# Patient Record
Sex: Male | Born: 1990 | Hispanic: No | Marital: Single | Smoking: Current some day smoker
Health system: Southern US, Community
[De-identification: ages and names within clinical notes are randomized; demographics above are authoritative.]

## PROBLEM LIST (undated history)

## (undated) DIAGNOSIS — D571 Sickle-cell disease without crisis: Secondary | ICD-10-CM

## (undated) DIAGNOSIS — K029 Dental caries, unspecified: Secondary | ICD-10-CM

## (undated) DIAGNOSIS — R040 Epistaxis: Secondary | ICD-10-CM

## (undated) DIAGNOSIS — N433 Hydrocele, unspecified: Secondary | ICD-10-CM

---
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
## 2010-01-25 ENCOUNTER — Emergency Department (HOSPITAL_COMMUNITY): Admission: EM | Admit: 2010-01-25 | Discharge: 2010-01-25 | Payer: Self-pay | Admitting: Family Medicine

## 2015-12-15 ENCOUNTER — Other Ambulatory Visit: Payer: Self-pay | Admitting: Family

## 2015-12-15 DIAGNOSIS — N5089 Other specified disorders of the male genital organs: Secondary | ICD-10-CM

## 2015-12-23 ENCOUNTER — Ambulatory Visit
Admission: RE | Admit: 2015-12-23 | Discharge: 2015-12-23 | Disposition: A | Discharge status: Home / Self Care | Payer: BC Managed Care – PPO | Source: Ambulatory Visit | Attending: Family | Admitting: Family

## 2015-12-23 DIAGNOSIS — N5089 Other specified disorders of the male genital organs: Secondary | ICD-10-CM

## 2016-02-21 ENCOUNTER — Other Ambulatory Visit: Payer: Self-pay | Admitting: Urology

## 2016-03-21 ENCOUNTER — Encounter (HOSPITAL_BASED_OUTPATIENT_CLINIC_OR_DEPARTMENT_OTHER): Payer: Self-pay | Admitting: *Deleted

## 2016-03-21 NOTE — Progress Notes (Signed)
Pt instructed npo pmn 10/08.  [facility] 1000.  Needs hgb on arrival.

## 2016-03-26 ENCOUNTER — Encounter (HOSPITAL_BASED_OUTPATIENT_CLINIC_OR_DEPARTMENT_OTHER)
Admission: RE | Disposition: A | Discharge status: Home / Self Care | Payer: Self-pay | Source: Ambulatory Visit | Attending: Urology

## 2016-03-26 ENCOUNTER — Ambulatory Visit (HOSPITAL_BASED_OUTPATIENT_CLINIC_OR_DEPARTMENT_OTHER): Payer: BC Managed Care – PPO | Admitting: Anesthesiology

## 2016-03-26 ENCOUNTER — Encounter (HOSPITAL_BASED_OUTPATIENT_CLINIC_OR_DEPARTMENT_OTHER): Payer: Self-pay | Admitting: Anesthesiology

## 2016-03-26 ENCOUNTER — Ambulatory Visit (HOSPITAL_BASED_OUTPATIENT_CLINIC_OR_DEPARTMENT_OTHER)
Admission: RE | Admit: 2016-03-26 | Discharge: 2016-03-26 | Disposition: A | Discharge status: Home / Self Care | Payer: BC Managed Care – PPO | Source: Ambulatory Visit | Attending: Urology | Admitting: Urology

## 2016-03-26 DIAGNOSIS — D571 Sickle-cell disease without crisis: Secondary | ICD-10-CM | POA: Insufficient documentation

## 2016-03-26 DIAGNOSIS — N433 Hydrocele, unspecified: Secondary | ICD-10-CM | POA: Insufficient documentation

## 2016-03-26 DIAGNOSIS — F17210 Nicotine dependence, cigarettes, uncomplicated: Secondary | ICD-10-CM | POA: Diagnosis not present

## 2016-03-26 HISTORY — DX: Hydrocele, unspecified: N43.3

## 2016-03-26 HISTORY — DX: Epistaxis: R04.0

## 2016-03-26 HISTORY — PX: HYDROCELE EXCISION: SHX482

## 2016-03-26 HISTORY — DX: Sickle-cell disease without crisis: D57.1

## 2016-03-26 HISTORY — DX: Dental caries, unspecified: K02.9

## 2016-03-26 LAB — POCT HEMOGLOBIN-HEMACUE: HEMOGLOBIN: 11.7 g/dL — AB (ref 13.0–17.0)

## 2016-03-26 SURGERY — HYDROCELECTOMY
Anesthesia: General | Laterality: Bilateral

## 2016-03-26 MED ORDER — MIDAZOLAM HCL 2 MG/2ML IJ SOLN
INTRAMUSCULAR | Status: AC
  Filled 2016-03-26: qty 2

## 2016-03-26 MED ORDER — DEXAMETHASONE SODIUM PHOSPHATE 10 MG/ML IJ SOLN
INTRAMUSCULAR | Status: AC
  Filled 2016-03-26: qty 1

## 2016-03-26 MED ORDER — CEFAZOLIN SODIUM-DEXTROSE 2-4 GM/100ML-% IV SOLN
INTRAVENOUS | Status: AC
  Filled 2016-03-26: qty 100

## 2016-03-26 MED ORDER — PROPOFOL 10 MG/ML IV BOLUS
INTRAVENOUS | Status: AC
  Filled 2016-03-26: qty 20

## 2016-03-26 MED ORDER — WHITE PETROLATUM GEL
Status: AC
  Filled 2016-03-26: qty 5

## 2016-03-26 MED ORDER — OXYCODONE-ACETAMINOPHEN 5-325 MG PO TABS: 1.0000 | ORAL_TABLET | ORAL | 0 refills | Status: AC | PRN

## 2016-03-26 MED ORDER — OXYCODONE HCL 5 MG PO TABS
5.0000 mg | ORAL_TABLET | Freq: Once | ORAL | Status: AC | PRN
  Administered 2016-03-26: 5 mg via ORAL
  Filled 2016-03-26: qty 1

## 2016-03-26 MED ORDER — DEXAMETHASONE SODIUM PHOSPHATE 4 MG/ML IJ SOLN
INTRAMUSCULAR | Status: DC | PRN
  Administered 2016-03-26: 10 mg via INTRAVENOUS

## 2016-03-26 MED ORDER — PHENYLEPHRINE 40 MCG/ML (10ML) SYRINGE FOR IV PUSH (FOR BLOOD PRESSURE SUPPORT)
PREFILLED_SYRINGE | INTRAVENOUS | Status: AC
Start: 2016-03-26 — End: 2016-03-26
  Filled 2016-03-26: qty 20

## 2016-03-26 MED ORDER — SULFAMETHOXAZOLE-TRIMETHOPRIM 800-160 MG PO TABS: 1.0000 | ORAL_TABLET | Freq: Two times a day (BID) | ORAL | 0 refills | Status: AC

## 2016-03-26 MED ORDER — FENTANYL CITRATE (PF) 100 MCG/2ML IJ SOLN
25.0000 ug | INTRAMUSCULAR | Status: DC | PRN
  Administered 2016-03-26 (×3): 50 ug via INTRAVENOUS
  Filled 2016-03-26: qty 1

## 2016-03-26 MED ORDER — BUPIVACAINE HCL (PF) 0.25 % IJ SOLN
INTRAMUSCULAR | Status: DC | PRN
  Administered 2016-03-26: 10 mL

## 2016-03-26 MED ORDER — CEFAZOLIN IN D5W 1 GM/50ML IV SOLN
1.0000 g | INTRAVENOUS | Status: DC
  Filled 2016-03-26: qty 50

## 2016-03-26 MED ORDER — FENTANYL CITRATE (PF) 100 MCG/2ML IJ SOLN
INTRAMUSCULAR | Status: AC
  Filled 2016-03-26: qty 2

## 2016-03-26 MED ORDER — FENTANYL CITRATE (PF) 100 MCG/2ML IJ SOLN
INTRAMUSCULAR | Status: DC | PRN
  Administered 2016-03-26 (×2): 50 ug via INTRAVENOUS

## 2016-03-26 MED ORDER — BUPIVACAINE HCL (PF) 0.25 % IJ SOLN
INTRAMUSCULAR | Status: AC
  Filled 2016-03-26: qty 30

## 2016-03-26 MED ORDER — LACTATED RINGERS IV SOLN
INTRAVENOUS | Status: DC
  Administered 2016-03-26 (×2): via INTRAVENOUS
  Filled 2016-03-26 (×2): qty 1000

## 2016-03-26 MED ORDER — CEFAZOLIN SODIUM-DEXTROSE 2-4 GM/100ML-% IV SOLN
2.0000 g | INTRAVENOUS | Status: AC
  Administered 2016-03-26: 2 g via INTRAVENOUS
  Filled 2016-03-26: qty 100

## 2016-03-26 MED ORDER — OXYCODONE HCL 5 MG PO TABS
ORAL_TABLET | ORAL | Status: AC
Start: 2016-03-26 — End: 2016-03-26
  Filled 2016-03-26: qty 1

## 2016-03-26 MED ORDER — OXYCODONE HCL 5 MG/5ML PO SOLN
5.0000 mg | Freq: Once | ORAL | Status: AC | PRN
  Filled 2016-03-26: qty 5

## 2016-03-26 MED ORDER — MIDAZOLAM HCL 5 MG/5ML IJ SOLN
INTRAMUSCULAR | Status: DC | PRN
  Administered 2016-03-26: 2 mg via INTRAVENOUS

## 2016-03-26 MED ORDER — PROMETHAZINE HCL 25 MG/ML IJ SOLN
6.2500 mg | INTRAMUSCULAR | Status: DC | PRN
  Filled 2016-03-26: qty 1

## 2016-03-26 MED ORDER — ONDANSETRON HCL 4 MG/2ML IJ SOLN
INTRAMUSCULAR | Status: DC | PRN
  Administered 2016-03-26: 4 mg via INTRAVENOUS

## 2016-03-26 MED ORDER — ONDANSETRON HCL 4 MG/2ML IJ SOLN
INTRAMUSCULAR | Status: AC
  Filled 2016-03-26: qty 2

## 2016-03-26 MED ORDER — PROPOFOL 10 MG/ML IV BOLUS
INTRAVENOUS | Status: DC | PRN
  Administered 2016-03-26: 200 mg via INTRAVENOUS

## 2016-03-26 MED ORDER — LIDOCAINE 2% (20 MG/ML) 5 ML SYRINGE
INTRAMUSCULAR | Status: DC | PRN
  Administered 2016-03-26: 75 mg via INTRAVENOUS

## 2016-03-26 SURGICAL SUPPLY — 43 items
BAG URINE DRAINAGE (UROLOGICAL SUPPLIES) IMPLANT
BLADE CLIPPER SENSICLIP SURGIC (BLADE) IMPLANT
BLADE SURG 15 STRL LF DISP TIS (BLADE) ×1 IMPLANT
BLADE SURG 15 STRL SS (BLADE) ×2
CATH FOLEY 2WAY SLVR  5CC 16FR (CATHETERS)
CATH FOLEY 2WAY SLVR 5CC 16FR (CATHETERS) IMPLANT
COVER BACK TABLE 60X90IN (DRAPES) ×3 IMPLANT
COVER MAYO STAND STRL (DRAPES) ×3 IMPLANT
DISSECTOR ROUND CHERRY 3/8 STR (MISCELLANEOUS) IMPLANT
DRAIN PENROSE 18X1/2 LTX STRL (DRAIN) ×3 IMPLANT
DRAIN PENROSE 18X1/4 LTX STRL (WOUND CARE) ×3 IMPLANT
DRAPE LAPAROTOMY 100X72 PEDS (DRAPES) ×3 IMPLANT
DRSG TEGADERM 4X4.75 (GAUZE/BANDAGES/DRESSINGS) IMPLANT
ELECT NEEDLE BLADE 2-5/6 (NEEDLE) ×3 IMPLANT
ELECT REM PT RETURN 9FT ADLT (ELECTROSURGICAL) ×3
ELECTRODE REM PT RTRN 9FT ADLT (ELECTROSURGICAL) ×1 IMPLANT
GLOVE BIO SURGEON STRL SZ8 (GLOVE) ×3 IMPLANT
GOWN STRL REUS W/ TWL LRG LVL3 (GOWN DISPOSABLE) ×1 IMPLANT
GOWN STRL REUS W/ TWL XL LVL3 (GOWN DISPOSABLE) ×1 IMPLANT
GOWN STRL REUS W/TWL LRG LVL3 (GOWN DISPOSABLE) ×2
GOWN STRL REUS W/TWL XL LVL3 (GOWN DISPOSABLE) ×2
KIT ROOM TURNOVER WOR (KITS) ×3 IMPLANT
LIQUID BAND (GAUZE/BANDAGES/DRESSINGS) ×3 IMPLANT
MANIFOLD NEPTUNE II (INSTRUMENTS) IMPLANT
NEEDLE HYPO 25X1 1.5 SAFETY (NEEDLE) ×3 IMPLANT
NS IRRIG 500ML POUR BTL (IV SOLUTION) IMPLANT
PACK BASIN DAY SURGERY FS (CUSTOM PROCEDURE TRAY) ×3 IMPLANT
PENCIL BUTTON HOLSTER BLD 10FT (ELECTRODE) ×3 IMPLANT
SUPPORT SCROTAL LG STRP (MISCELLANEOUS) ×2 IMPLANT
SUPPORTER ATHLETIC LG (MISCELLANEOUS) ×1
SUT ETHILON 4 0 PS 2 18 (SUTURE) ×3 IMPLANT
SUT MNCRL AB 4-0 PS2 18 (SUTURE) ×3 IMPLANT
SUT SILK 0 SH 30 (SUTURE) IMPLANT
SUT VIC AB 2-0 SH 27 (SUTURE) ×4
SUT VIC AB 2-0 SH 27XBRD (SUTURE) ×2 IMPLANT
SYR 30ML LL (SYRINGE) IMPLANT
SYR CONTROL 10ML LL (SYRINGE) ×3 IMPLANT
TOWEL OR 17X24 6PK STRL BLUE (TOWEL DISPOSABLE) ×6 IMPLANT
TRAY DSU PREP LF (CUSTOM PROCEDURE TRAY) ×3 IMPLANT
TUBE CONNECTING 12'X1/4 (SUCTIONS) ×1
TUBE CONNECTING 12X1/4 (SUCTIONS) ×2 IMPLANT
WATER STERILE IRR 500ML POUR (IV SOLUTION) IMPLANT
YANKAUER SUCT BULB TIP NO VENT (SUCTIONS) ×3 IMPLANT

## 2016-03-26 NOTE — H&P (Signed)
Urology Admission H&P  Chief Complaint: scrotal swelling  History of Present Illness: Mr Gwynneth Munsonxum is a 25yo with a h xof bilateral hydroceles that are causing pain and difficulty with walking  Past Medical History:  Diagnosis Date  . Dental cavities    and cracks  . Hydrocele, bilateral   . Nosebleed    hx of frequent nosebleeds as a child  . Sickle cell disease (HCC)    History reviewed. No pertinent surgical history.  Home Medications:  Prescriptions Prior to Admission  Medication Sig Dispense Refill Last Dose  . Multiple Vitamins-Minerals (MULTIVITAMIN WITH MINERALS) tablet Take 1 tablet by mouth daily.   03/25/2016 at Unknown time   Allergies: No Known Allergies  History reviewed. No pertinent family history. Social History:  reports that he has been smoking.  He has been smoking about 0.25 packs per day. He has never used smokeless tobacco. He reports that he drinks alcohol. His drug history is not on file.  Review of Systems  All other systems reviewed and are negative.   Physical Exam:  Vital signs in last 24 hours: Temp:  [98.6 F (37 C)] 98.6 F (37 C) (10/09 1047) Pulse Rate:  [89] 89 (10/09 1047) Resp:  [16] 16 (10/09 1047) BP: (158)/(68) 158/68 (10/09 1047) SpO2:  [100 %] 100 % (10/09 1047) Weight:  [81.2 kg (179 lb)] 81.2 kg (179 lb) (10/09 1047) Physical Exam  Constitutional: He is oriented to person, place, and time. He appears well-developed and well-nourished.  HENT:  Head: Normocephalic and atraumatic.  Eyes: EOM are normal. Pupils are equal, round, and reactive to light.  Neck: Normal range of motion. No thyromegaly present.  Cardiovascular: Normal rate and regular rhythm.   Respiratory: Effort normal. No respiratory distress.  GI: Soft. He exhibits no distension.  Musculoskeletal: Normal range of motion. He exhibits no edema.  Neurological: He is alert and oriented to person, place, and time.  Skin: Skin is warm and dry.  Psychiatric: He has a  normal mood and affect. His behavior is normal. Judgment and thought content normal.    Laboratory Data:  Results for orders placed or performed during the hospital encounter of 03/26/16 (from the past 24 hour(s))  Hemoglobin-hemacue, POC     Status: Abnormal   Collection Time: 03/26/16 11:10 AM  Result Value Ref Range   Hemoglobin 11.7 (L) 13.0 - 17.0 g/dL   No results found for this or any previous visit (from the past 240 hour(s)). Creatinine: No results for input(s): CREATININE in the last 168 hours. Baseline Creatinine: unknwon  Impression/Assessment:  25yo with bilateral hydroceles  Plan:  The risks/benefits/alternatives to bilateral hydrocelectomy was explained to the patietn and she understands and wishes to proceed with surgery  Wilkie AyePatrick Deja Kaigler 03/26/2016, 11:48 AM

## 2016-03-26 NOTE — Anesthesia Procedure Notes (Signed)
Procedure Name: LMA Insertion Date/Time: 03/26/2016 11:58 AM Performed by: Tyrone NineSAUVE, Casin Federici F Pre-anesthesia Checklist: Patient identified, Timeout performed, Emergency Drugs available, Suction available and Patient being monitored Patient Re-evaluated:Patient Re-evaluated prior to inductionOxygen Delivery Method: Circle system utilized Preoxygenation: Pre-oxygenation with 100% oxygen Intubation Type: IV induction Ventilation: Mask ventilation without difficulty LMA: LMA inserted LMA Size: 4.0 Number of attempts: 1 Placement Confirmation: positive ETCO2 and breath sounds checked- equal and bilateral Tube secured with: Tape Dental Injury: Teeth and Oropharynx as per pre-operative assessment

## 2016-03-26 NOTE — Op Note (Signed)
Preoperative diagnosis: bilateral Hydrocele  Postoperative diagnosis: Same  Procedure: 1. Excision of bilateral appendix testis 2. bilateral hydrocelectomy  Attending: Wilkie AyePatrick Courteney Alderete, MD  Anesthesia: General  History of blood loss: Minimal  Antibiotics: ancef  Drains: bilateral penrose  Specimens: 1. Bilateral  hydrocele sac   Findings: large bilateral hydroceles  Indications: Patient is a 25 year old male with a history of bilateral hydrocele that was growing in size and causing him pain with walking.  We discussed the treatment options including observation versus excision after discussing treatment options he proceed with excision.   Procedure in detail: Prior to procedure consent was obtained.  Patient was brought to the operating room and a brief timeout was done to ensure correct patient, correct procedure, correct site.  General anesthesia was administered and patient was placed in supine position.  His genitalia was then prepped and draped in usual sterile fashion.  A 3 cm incision was made in the right hemiscrotum.  We dissected down to the tunica and then incised the tunica. A large hydrocele was encountered and was drained. We then excised the hydrocele sac and then over sewed the edge with 2-0 Vicryl in a running fashion. We then excised the right appendix testis. Hemostasis was then obtained with electrocautery. We then closed the defect in the epididymis with 3-0 vicryl in a running fashion. We then returned the testis to the rightt hemiscrotum and placed a drain through a separate incision. We then closed the overlying dartos with 3-0 vicryl in a running fashion.  We then turned our attention to the left side. A 3 cm incision was made in the left hemiscrotum.  We dissected down to the tunica and then incised the tunica. A large hydrocele was encountered and was drained. We then excised the hydrocele sac and then over sewed the edge with 2-0 Vicryl in a running fashion. We  then excised the right appendix testis. Hemostasis was then obtained with electrocautery. We then closed the defect in the epididymis with 3-0 vicryl in a running fashion. We then returned the testis to the left hemiscrotum and  Placed a drain throught a separate incision. We closed the overlying dartos with 3-0 vicryl in a running fashion. The skin was then closed with 4-0 monocryl in a running fashion. Dermabond was placed on the incision.  A dressing was then applied to the incision.  We then placed a scrotal fluff and this then concluded the procedure which was well tolerated by the patient.  Complications: None  Condition: Stable, extubated, transferred to PACU.  Plan: Patient is to be discharged home.  He is to remove his drains in 2 days. He is to follow up in 2 weeks for wound check.

## 2016-03-26 NOTE — Discharge Instructions (Addendum)
Hydrocelectomy, Care After Refer to this sheet in the next few weeks. These instructions provide you with information about caring for yourself after your procedure. Your health care provider may also give you more specific instructions. Your treatment has been planned according to current medical practices, but problems sometimes occur. Call your health care provider if you have any problems or questions after your procedure. WHAT TO EXPECT AFTER THE PROCEDURE After your procedure, it is common for the pouch that holds your testicles (scrotum) to be painful, swollen, and bruised. HOME CARE INSTRUCTIONS Bathing  Ask your health care provider when you can shower, take baths, or go swimming.  If you were told to wear an athletic support strap, take it off when you shower or take a bath. Incision Care  Follow instructions from your health care provider about how to take care of your incision. Make sure you:  Wash your hands with soap and water before you change your bandage (dressing). If soap and water are not available, use hand sanitizer.  Change your dressing as told by your health care provider.  Leave stitches (sutures) in place.  Check your incision and scrotum every day for signs of infection. Check for:  More redness, swelling, or pain.  Blood or fluid.  Warmth.  Pus or a bad smell. Managing Pain, Stiffness, and Swelling  If directed, apply ice to the injured area:  Put ice in a plastic bag.  Place a towel between your skin and the bag.  Leave the ice on for 20 minutes, 2-3 times per day. Driving  Do not drive for 24 hours if you received a sedative.  Do not drive or operate heavy machinery while taking prescription pain medicine.  Ask your health care provider when it is safe to drive. Activity  Do not do any activities that require great strength and energy (are vigorous) for as long as told by your health care provider.  Return to your normal activities as  told by your health care provider. Ask your health care provider what activities are safe for you.  Do not lift anything that is heavier than 10 lb (4.5 kg) until your health care provider says that it is safe. General Instructions  Take over-the-counter and prescription medicines only as told by your health care provider.  Keep all follow-up visits as told by your health care provider. This is important.  If you were given an athletic support strap, wear it as told by your health care provider.  If you had a drain put in during the procedure, you will need to return to have it removed. SEEK MEDICAL CARE IF:  Your pain gets worse.  You have more redness, swelling, or pain around your scrotum.  You have blood or fluid coming from your scrotum.  Your incision feels warm to the touch.  You have pus or a bad smell coming from your scrotum.  You have a fever.    This information is not intended to replace advice given to you by your health care provider. Make sure you discuss any questions you have with your health care provider.   Document Released: 02/23/2015 Document Reviewed: 12/01/2014 Elsevier Interactive Patient Education 2016 [employer].  Remove drain Wednesday or Thursday at home  Post Anesthesia Home Care Instructions  Activity: Get plenty of rest for the remainder of the day. A responsible adult should stay with you for 24 hours following the procedure.  For the next 24 hours, DO NOT: -Drive a car -  Operate machinery -Drink alcoholic beverages -Take any medication unless instructed by your physician -Make any legal decisions or sign important papers.  Meals: Start with liquid foods such as gelatin or soup. Progress to regular foods as tolerated. Avoid greasy, spicy, heavy foods. If nausea and/or vomiting occur, drink only clear liquids until the nausea and/or vomiting subsides. Call your physician if vomiting continues.  Special Instructions/Symptoms: Your  throat may feel dry or sore from the anesthesia or the breathing tube placed in your throat during surgery. If this causes discomfort, gargle with warm salt water. The discomfort should disappear within 24 hours.  If you had a scopolamine patch placed behind your ear for the management of post- operative nausea and/or vomiting:  1. The medication in the patch is effective for 72 hours, after which it should be removed.  Wrap patch in a tissue and discard in the trash. Wash hands thoroughly with soap and water. 2. You may remove the patch earlier than 72 hours if you experience unpleasant side effects which may include dry mouth, dizziness or visual disturbances. 3. Avoid touching the patch. Wash your hands with soap and water after contact with the patch.

## 2016-03-26 NOTE — Anesthesia Preprocedure Evaluation (Addendum)
Anesthesia Evaluation  Patient identified by MRN, date of birth, ID band Patient awake    Reviewed: Allergy & Precautions, NPO status , Patient's Chart, lab work & pertinent test results  Airway Mallampati: II  TM Distance: >3 FB Neck ROM: Full    Dental no notable dental hx.    Pulmonary Current Smoker,    Pulmonary exam normal breath sounds clear to auscultation       Cardiovascular negative cardio ROS Normal cardiovascular exam Rhythm:Regular Rate:Normal     Neuro/Psych negative neurological ROS  negative psych ROS   GI/Hepatic negative GI ROS, Neg liver ROS,   Endo/Other  negative endocrine ROS  Renal/GU negative Renal ROS  negative genitourinary   Musculoskeletal negative musculoskeletal ROS (+)   Abdominal   Peds negative pediatric ROS (+)  Hematology Sickle cell disease (HCC)   Anesthesia Other Findings   Reproductive/Obstetrics negative OB ROS                             Anesthesia Physical Anesthesia Plan  ASA: II  Anesthesia Plan: General   Post-op Pain Management:    Induction: Intravenous  Airway Management Planned: LMA  Additional Equipment:   Intra-op Plan:   Post-operative Plan: Extubation in OR  Informed Consent: I have reviewed the patients History and Physical, chart, labs and discussed the procedure including the risks, benefits and alternatives for the proposed anesthesia with the patient or authorized representative who has indicated his/her understanding and acceptance.   Dental advisory given  Plan Discussed with: CRNA and Surgeon  Anesthesia Plan Comments:         Anesthesia Quick Evaluation

## 2016-03-26 NOTE — Transfer of Care (Signed)
Immediate Anesthesia Transfer of Care Note  Patient: Jeremy Young  Procedure(s) Performed: Procedure(s): HYDROCELECTOMY ADULT (Bilateral)  Patient Location: PACU  Anesthesia Type:General  Level of Consciousness: awake, alert , oriented and patient cooperative  Airway & Oxygen Therapy: Patient Spontanous Breathing and Patient connected to nasal cannula oxygen  Post-op Assessment: Report given to RN and Post -op Vital signs reviewed and stable  Post vital signs: Reviewed and stable  Last Vitals:  Vitals:   03/26/16 1047  BP: (!) 158/68  Pulse: 89  Resp: 16  Temp: 37 C    Last Pain:  Vitals:   03/26/16 1047  TempSrc: Oral      Patients Stated Pain Goal: 7 (03/26/16 1047)  Complications: No apparent anesthesia complications

## 2016-03-27 ENCOUNTER — Encounter (HOSPITAL_BASED_OUTPATIENT_CLINIC_OR_DEPARTMENT_OTHER): Payer: Self-pay | Admitting: Urology

## 2016-03-27 NOTE — Anesthesia Postprocedure Evaluation (Signed)
Anesthesia Post Note  Patient: Jeremy Young  Procedure(s) Performed: Procedure(s) (LRB): HYDROCELECTOMY ADULT (Bilateral)  Patient location during evaluation: PACU Anesthesia Type: General Level of consciousness: awake and alert Pain management: pain level controlled Vital Signs Assessment: post-procedure vital signs reviewed and stable Respiratory status: spontaneous breathing, nonlabored ventilation, respiratory function stable and patient connected to nasal cannula oxygen Cardiovascular status: blood pressure returned to baseline and stable Postop Assessment: no signs of nausea or vomiting Anesthetic complications: no    Last Vitals:  Vitals:   03/26/16 1415 03/26/16 1430  BP: (!) 148/92 (!) 136/92  Pulse: (!) 55 (!) 53  Resp: 12 10  Temp:      Last Pain:  Vitals:   03/26/16 1450  TempSrc:   PainSc: 0-No pain                 Shelton SilvasKevin D Geselle Cardosa

## 2016-04-09 ENCOUNTER — Encounter (HOSPITAL_BASED_OUTPATIENT_CLINIC_OR_DEPARTMENT_OTHER): Payer: Self-pay | Admitting: Urology

## 2017-07-18 IMAGING — US US ART/VEN ABD/PELV/SCROTUM DOPPLER LTD
1 series · 14 of 25 positions shown · non-contrast
Comparison: None.

CLINICAL DATA: Right scrotal swelling x 11 years

EXAM:
SCROTAL ULTRASOUND
DOPPLER ULTRASOUND OF THE TESTICLES
TECHNIQUE: Complete ultrasound examination of the testicles, epididymis, and
other scrotal structures was performed. Color and spectral Doppler
ultrasound were also utilized to evaluate blood flow to the
testicles.

[Series 1: us art/ven abd/pelv/scrotum doppler ltd · 14 of 36 slices shown]
[im 1/36]
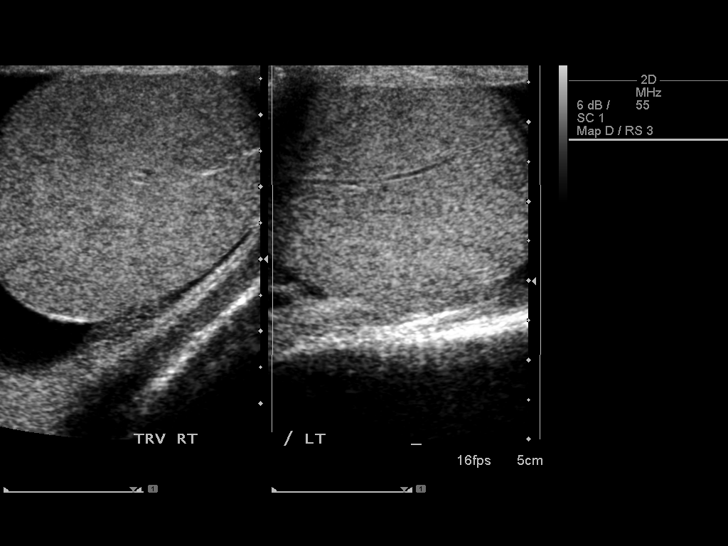
[im 3/36]
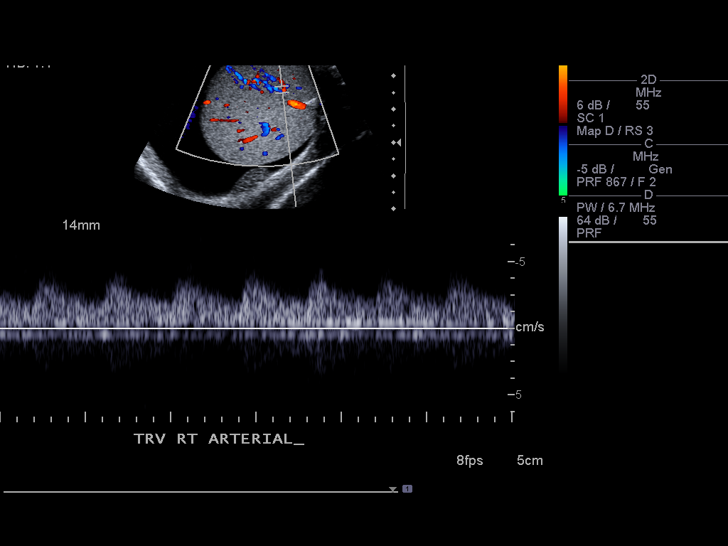
[im 6/36]
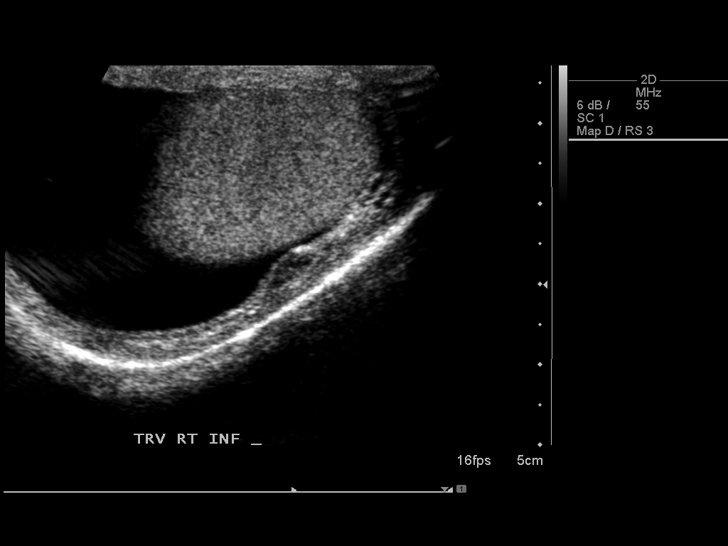
[im 9/36]
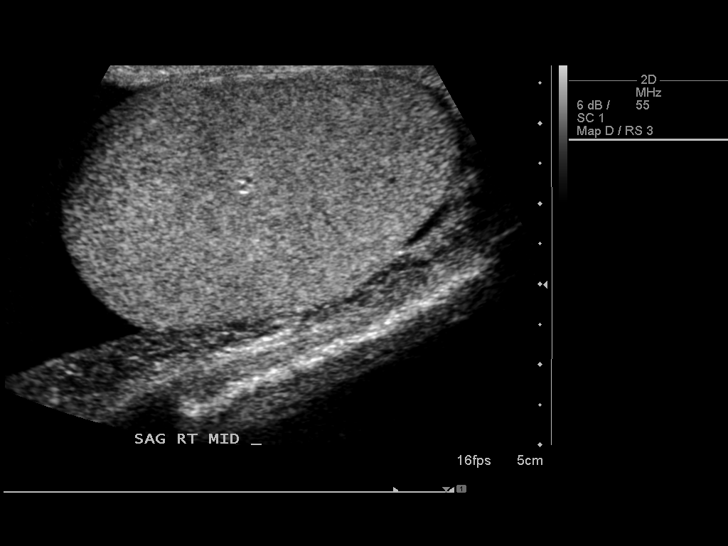
[im 12/36]
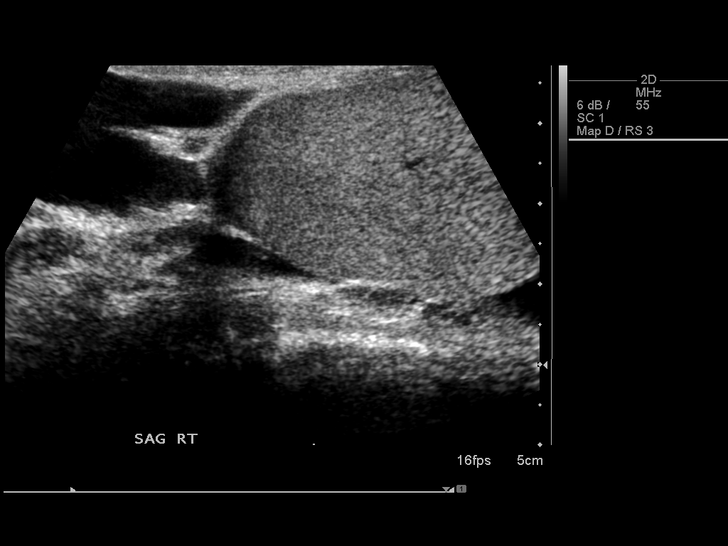
[im 14/36]
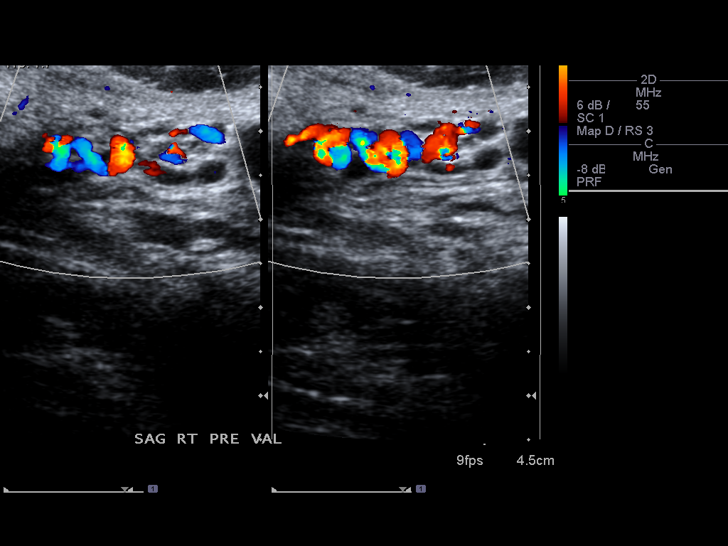
[im 17/36]
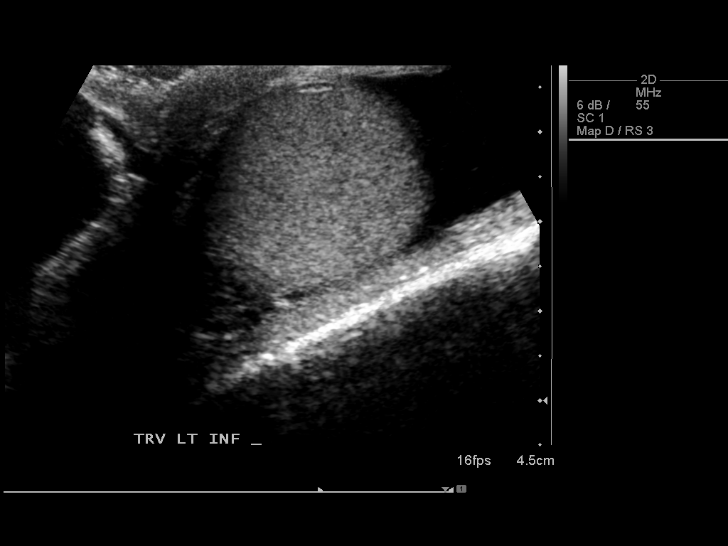
[im 19/36]
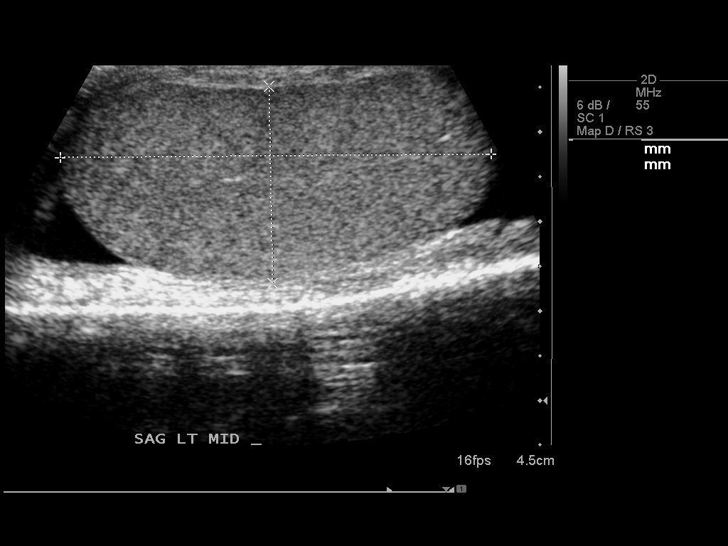
[im 22/36]
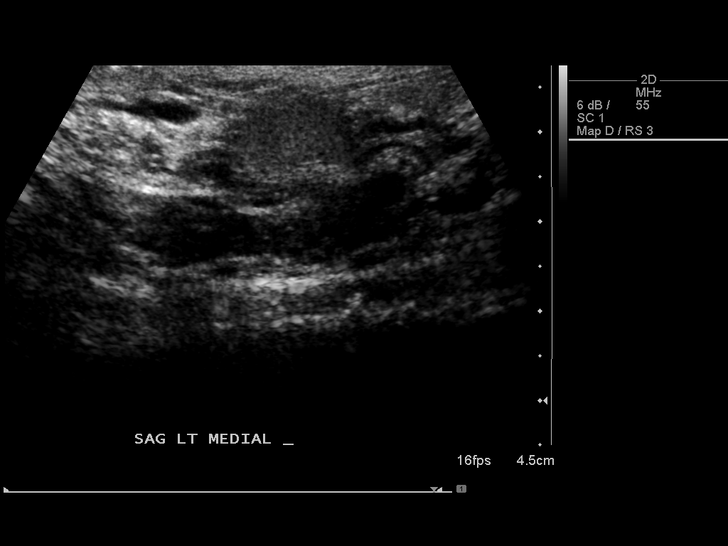
[im 24/36]
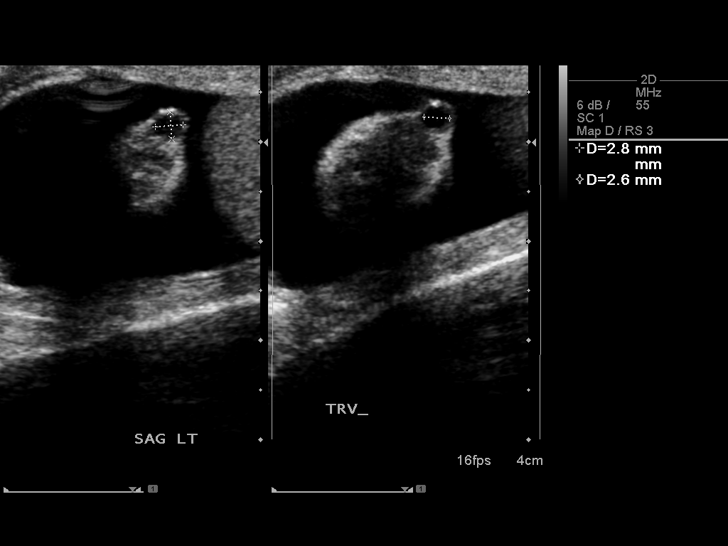
[im 27/36]
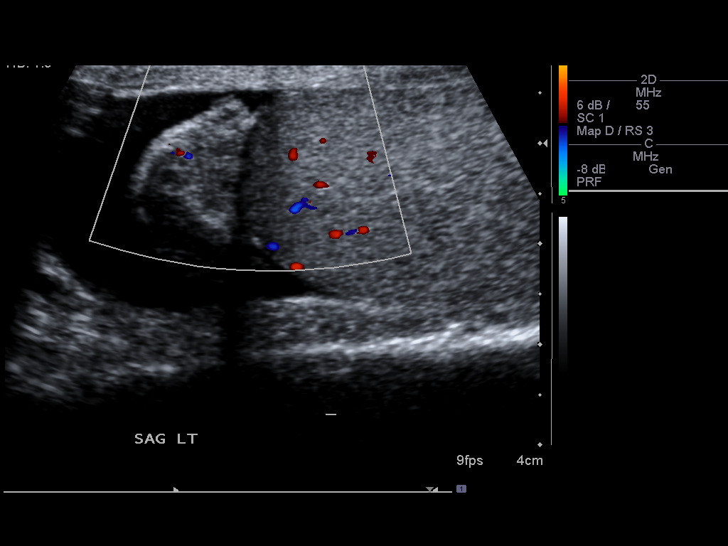
[im 30/36]
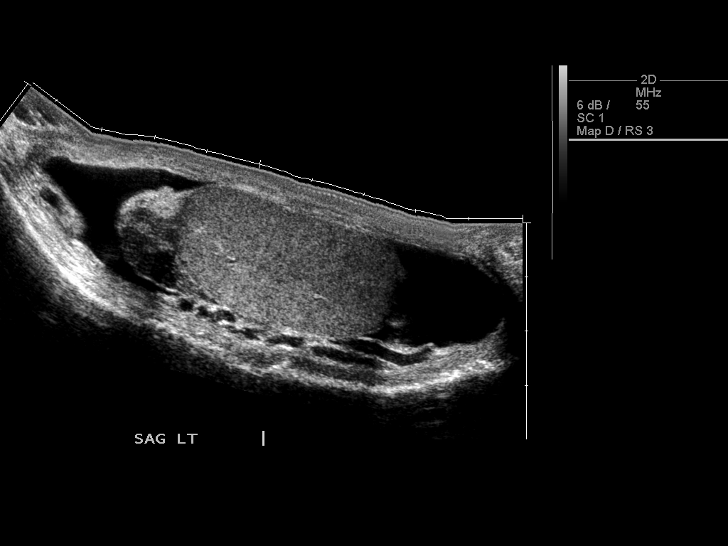
[im 33/36]
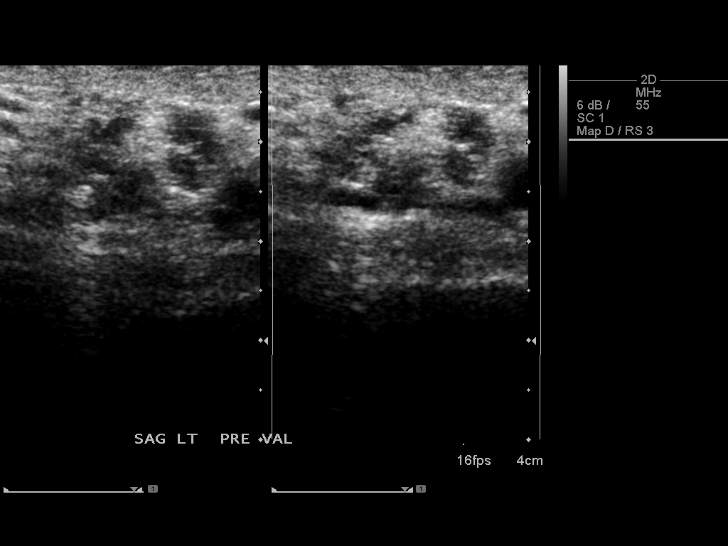
[im 36/36]
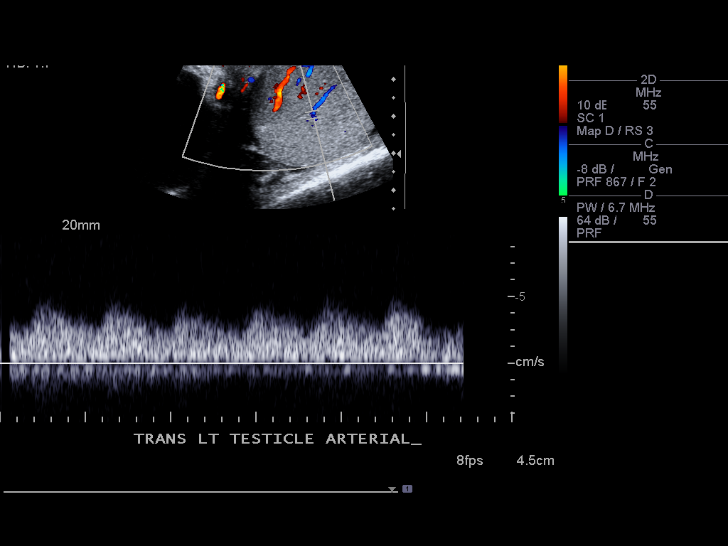

[14 of 25 positions shown; findings below may reference images not displayed]

FINDINGS: Right testicle

Measurements: 5.1 x 3.1 x 4.2 cm. No mass or microlithiasis
visualized.

Left testicle

Measurements: 4.8 x 2.2 x 3.8 cm. No mass or microlithiasis
visualized.

Right epididymis:  Not discretely visualized.

Left epididymis:  3 mm epididymal head cyst.

Hydrocele:  Large right and moderate left hydroceles.

Varicocele:  None visualized.

Pulsed Doppler interrogation of both testes demonstrates normal low
resistance arterial and venous waveforms bilaterally.
IMPRESSION: Normal sonographic appearance of the bilateral testes.

Large right and moderate left hydroceles.

No evidence of testicular torsion.

## 2017-10-07 ENCOUNTER — Telehealth: Payer: Self-pay | Admitting: *Deleted

## 2017-10-07 NOTE — Telephone Encounter (Signed)
Entered in error

## 2019-01-05 ENCOUNTER — Other Ambulatory Visit: Payer: Self-pay

## 2019-01-05 DIAGNOSIS — Z20822 Contact with and (suspected) exposure to covid-19: Secondary | ICD-10-CM

## 2019-01-08 LAB — SPECIMEN STATUS REPORT

## 2019-01-08 LAB — NOVEL CORONAVIRUS, NAA: SARS-CoV-2, NAA: NOT DETECTED

## 2019-01-12 ENCOUNTER — Telehealth: Payer: Self-pay | Admitting: Hematology

## 2019-01-12 NOTE — Telephone Encounter (Signed)
Pt is aware covid 19 test is negative °
